# Patient Record
Sex: Male | Born: 2000 | Race: White | Hispanic: No | Marital: Single | Smoking: Never smoker
Health system: Southern US, Community
[De-identification: ages and names within clinical notes are randomized; demographics above are authoritative.]

---
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
## 2019-01-24 NOTE — Assessment & Plan Note (Signed)
Decision today to treat with OMT was based on Physical Exam  After verbal consent patient was treated with HVLA, ME, FPR techniques in cervical, thoracic, lumbar and sacral areas  Patient tolerated the procedure well with improvement in symptoms  Patient given exercises, stretches and lifestyle modifications  See medications in patient instructions if given  Patient will follow up in 4-6 weeks 

## 2019-01-24 NOTE — Progress Notes (Signed)
Tawana ScaleZach  D.O. [facility] [address] [place], [place] [address] Phone: [phone] Subjective:     CC: back pain follow-up  BJY:[id number]:Subjective  Dustin Fernandez is a 18 y.o. male coming in with complaint of back pain. Patient states patient has known scoliosis.  Patient continues to have discomfort and pain.  Patient recently has been at home.  More stress with about starting college in the fall.  May be doing more at home and not moving as much as he got previously.  Patient is also starting on a landscaping job.    No past medical history on file. No past surgical history on file. Social History   Socioeconomic History  . Marital status: Single    Spouse name: Not on file  . Number of children: Not on file  . Years of education: Not on file  . Highest education level: Not on file  Occupational History  . Not on file  Social Needs  . Financial resource strain: Not on file  . Food insecurity    Worry: Not on file    Inability: Not on file  . Transportation needs    Medical: Not on file    Non-medical: Not on file  Tobacco Use  . Smoking status: Never Smoker  . Smokeless tobacco: Never Used  Substance and Sexual Activity  . Alcohol use: Not on file  . Drug use: Not on file  . Sexual activity: Not on file  Lifestyle  . Physical activity    Days per week: Not on file    Minutes per session: Not on file  . Stress: Not on file  Relationships  . Social Musicianconnections    Talks on phone: Not on file    Gets together: Not on file    Attends religious service: Not on file    Active member of club or organization: Not on file    Attends meetings of clubs or organizations: Not on file    Relationship status: Not on file  Other Topics Concern  . Not on file  Social History Narrative  . Not on file   Allergies  Allergen Reactions  . Shellfish Allergy Rash   No family history on file.       Current Outpatient Medications (Other):  .  cholecalciferol  (VITAMIN D) 1000 units tablet, Take 2,000 Units by mouth daily.    Past medical history, social, surgical and family history all reviewed in electronic medical record.  No pertanent information unless stated regarding to the chief complaint.   Review of Systems:  No headache, visual changes, nausea, vomiting, diarrhea, constipation, dizziness, abdominal pain, skin rash, fevers, chills, night sweats, weight loss, swollen lymph nodes, body aches, joint swelling, muscle aches, chest pain, shortness of breath, mood changes.   Objective  Blood pressure 110/76, height 5\' 7"  (1.702 m), weight 112 lb (50.8 kg).  General: No apparent distress alert and oriented x3 mood and affect normal, dressed appropriately.  HEENT: Pupils equal, extraocular movements intact  Respiratory: Patient's speak in full sentences and does not appear short of breath  Cardiovascular: No lower extremity edema, non tender, no erythema  Skin: Warm dry intact with no signs of infection or rash on extremities or on axial skeleton.  Abdomen: Soft nontender  Neuro: Cranial nerves II through XII are intact, neurovascularly intact in all extremities with 2+ DTRs and 2+ pulses.  Lymph: No lymphadenopathy of posterior or anterior cervical chain or axillae bilaterally.  Gait normal with  good balance and coordination.  MSK:  Non tender with full range of motion and good stability and symmetric strength and tone of shoulders, elbows, wrist, hip, knee and ankles bilaterally.  Hypermobility noted Back Exam:  Inspection: Lumbar scoliosis noted Motion: Flexion 45 deg, Extension 45 deg, Side Bending to 35 deg bilaterally,  Rotation to 35 deg bilaterally  SLR laying: Negative  XSLR laying: Negative  Palpable tenderness: Mild tenderness to palpation in the thoracolumbar junction.Marland Kitchen FABER: negative. Sensory change: Gross sensation intact to all lumbar and sacral dermatomes.  Reflexes: 2+ at both patellar tendons, 2+ at achilles tendons,  Babinski's downgoing.  Strength at foot  Plantar-flexion: 5/5 Dorsi-flexion: 5/5 Eversion: 5/5 Inversion: 5/5  Leg strength  Quad: 5/5 Hamstring: 5/5 Hip flexor: 5/5 Hip abductors: 5/5  Gait unremarkable.  Osteopathic findings  C7 flexed rotated and side bent left T3 through T5 neutral rotated left and side bent right inhaled third rib T9 extended rotated and side bent left L3 flexed rotated and side bent left  Sacrum right on right    Impression and Recommendations:     This case required medical decision making of moderate complexity. The above documentation has been reviewed and is accurate and complete Lyndal Pulley, DO       Note: This dictation was prepared with Dragon dictation along with smaller phrase technology. Any transcriptional errors that result from this process are unintentional.

## 2019-01-24 NOTE — Assessment & Plan Note (Signed)
Idiopathic scoliosis.  Has done very well with conservative therapy.  Patient's growth plates at this time are likely closing and could repeat x-rays.  Discussed icing regimen and home exercises.  Follow-up again in 4 to 6 weeks

## 2019-01-26 ENCOUNTER — Ambulatory Visit: Payer: Self-pay | Admitting: Family Medicine

## 2019-01-26 ENCOUNTER — Other Ambulatory Visit: Payer: Self-pay

## 2019-01-26 ENCOUNTER — Encounter: Payer: Self-pay | Admitting: Family Medicine

## 2019-01-26 VITALS — BP 110/76 | Ht 67.0 in | Wt 112.0 lb

## 2019-01-26 DIAGNOSIS — M41125 Adolescent idiopathic scoliosis, thoracolumbar region: Secondary | ICD-10-CM

## 2019-01-26 DIAGNOSIS — M999 Biomechanical lesion, unspecified: Secondary | ICD-10-CM

## 2019-01-26 NOTE — Patient Instructions (Addendum)
See me again in 4 weeks  

## 2019-02-22 NOTE — Progress Notes (Deleted)
[facility] [address] [place], [place] [address] Phone: [phone] Subjective:    I'm seeing this patient by the request  of:    CC:   BTD:[id number]  Dustin Fernandez is a 18 y.o. male coming in with complaint of ***  Onset-  Location Duration-  Character- Aggravating factors- Reliving factors-  Therapies tried-  Severity-     No past medical history on file. No past surgical history on file. Social History   Socioeconomic History  . Marital status: Single    Spouse name: Not on file  . Number of children: Not on file  . Years of education: Not on file  . Highest education level: Not on file  Occupational History  . Not on file  Social Needs  . Financial resource strain: Not on file  . Food insecurity    Worry: Not on file    Inability: Not on file  . Transportation needs    Medical: Not on file    Non-medical: Not on file  Tobacco Use  . Smoking status: Never Smoker  . Smokeless tobacco: Never Used  Substance and Sexual Activity  . Alcohol use: Not on file  . Drug use: Not on file  . Sexual activity: Not on file  Lifestyle  . Physical activity    Days per week: Not on file    Minutes per session: Not on file  . Stress: Not on file  Relationships  . Social Herbalist on phone: Not on file    Gets together: Not on file    Attends religious service: Not on file    Active member of club or organization: Not on file    Attends meetings of clubs or organizations: Not on file    Relationship status: Not on file  Other Topics Concern  . Not on file  Social History Narrative  . Not on file   Allergies  Allergen Reactions  . Shellfish Allergy Rash   No family history on file.       Current Outpatient Medications (Other):  .  cholecalciferol (VITAMIN D) 1000 units tablet, Take 2,000 Units by mouth daily.    Past medical history, social, surgical and family history all reviewed in electronic  medical record.  No pertanent information unless stated regarding to the chief complaint.   Review of Systems:  No headache, visual changes, nausea, vomiting, diarrhea, constipation, dizziness, abdominal pain, skin rash, fevers, chills, night sweats, weight loss, swollen lymph nodes, body aches, joint swelling, muscle aches, chest pain, shortness of breath, mood changes.   Objective  There were no vitals taken for this visit. Systems examined below as of    General: No apparent distress alert and oriented x3 mood and affect normal, dressed appropriately.  HEENT: Pupils equal, extraocular movements intact  Respiratory: Patient's speak in full sentences and does not appear short of breath  Cardiovascular: No lower extremity edema, non tender, no erythema  Skin: Warm dry intact with no signs of infection or rash on extremities or on axial skeleton.  Abdomen: Soft nontender  Neuro: Cranial nerves II through XII are intact, neurovascularly intact in all extremities with 2+ DTRs and 2+ pulses.  Lymph: No lymphadenopathy of posterior or anterior cervical chain or axillae bilaterally.  Gait normal with good balance and coordination.  MSK:  Non tender with full range of motion and good stability and symmetric strength and tone of shoulders, elbows, wrist, hip, knee and  ankles bilaterally.  Back Exam:  Inspection: Scoliosis noted Motion: Flexion 45 deg, Extension 25 deg, Side Bending to 45 deg bilaterally,  Rotation to 45 deg bilaterally  SLR laying: Negative  XSLR laying: Negative  Palpable tenderness: None. FABER: negative. Sensory change: Gross sensation intact to all lumbar and sacral dermatomes.  Reflexes: 2+ at both patellar tendons, 2+ at achilles tendons, Babinski's downgoing.  Strength at foot  Plantar-flexion: 5/5 Dorsi-flexion: 5/5 Eversion: 5/5 Inversion: 5/5  Leg strength  Quad: 5/5 Hamstring: 5/5 Hip flexor: 5/5 Hip abductors: 5/5  Gait unremarkable.  Osteopathic findings  C4  flexed rotated and side bent left C6 flexed rotated and side bent left T3 extended rotated and side bent right inhaled third rib T9 extended rotated and side bent left L2 flexed rotated and side bent right Sacrum right on right    Impression and Recommendations:     This case required medical decision making of moderate complexity. The above documentation has been reviewed and is accurate and complete Dustin SaaZachary M Morena Mckissack, DO       Note: This dictation was prepared with Dragon dictation along with smaller phrase technology. Any transcriptional errors that result from this process are unintentional.

## 2019-02-23 ENCOUNTER — Ambulatory Visit: Payer: Self-pay | Admitting: Family Medicine

## 2019-07-22 ENCOUNTER — Ambulatory Visit: Payer: HRSA Program | Attending: Internal Medicine

## 2019-07-22 DIAGNOSIS — Z20828 Contact with and (suspected) exposure to other viral communicable diseases: Secondary | ICD-10-CM | POA: Insufficient documentation

## 2019-07-22 DIAGNOSIS — Z20822 Contact with and (suspected) exposure to covid-19: Secondary | ICD-10-CM

## 2019-07-23 LAB — NOVEL CORONAVIRUS, NAA: SARS-CoV-2, NAA: NOT DETECTED

## 2020-02-09 DIAGNOSIS — Z0000 Encounter for general adult medical examination without abnormal findings: Secondary | ICD-10-CM | POA: Diagnosis not present

## 2021-08-31 ENCOUNTER — Encounter (HOSPITAL_COMMUNITY): Payer: Self-pay

## 2021-08-31 ENCOUNTER — Other Ambulatory Visit: Payer: Self-pay

## 2021-08-31 ENCOUNTER — Ambulatory Visit (INDEPENDENT_AMBULATORY_CARE_PROVIDER_SITE_OTHER): Payer: Self-pay

## 2021-08-31 ENCOUNTER — Ambulatory Visit (HOSPITAL_COMMUNITY)
Admission: EM | Admit: 2021-08-31 | Discharge: 2021-08-31 | Disposition: A | Discharge status: Home / Self Care | Payer: Self-pay | Attending: Student | Admitting: Student

## 2021-08-31 DIAGNOSIS — S93491A Sprain of other ligament of right ankle, initial encounter: Secondary | ICD-10-CM

## 2021-08-31 DIAGNOSIS — M25571 Pain in right ankle and joints of right foot: Secondary | ICD-10-CM

## 2021-08-31 NOTE — ED Provider Notes (Signed)
[facility]    CSN: [id number] Arrival date & time: 08/31/21  1344      History   Chief Complaint Chief Complaint  Patient presents with   Ankle Pain    HPI MACY LINGENFELTER is a 21 y.o. male presenting with right ankle pain following a skateboarding injury.  Medical history noncontributory, denies history of issues with his ankle in the past.  States that he was skateboarding 1 day ago when he jumped off the skateboard and inverted his right ankle.  Now with pain and swelling over the lateral aspect.  Denies sensation changes.  His family had an ASO brace already, which he is using with some relief.  Pain is worse with ambulating and with movement, but tolerable at rest.  Has attempted ibuprofen with some relief.  Denies sensation changes.  HPI  History reviewed. No pertinent past medical history.  Patient Active Problem List   Diagnosis Date Noted   Nonallopathic lesion of sacral region 10/12/2015   Idiopathic scoliosis 01/06/2015   Nonallopathic lesion of thoracic region 01/06/2015   Nonallopathic lesion of lumbosacral region 01/06/2015    History reviewed. No pertinent surgical history.     Home Medications    Prior to Admission medications   Medication Sig Start Date End Date Taking? Authorizing Provider  cholecalciferol (VITAMIN D) 1000 units tablet Take 2,000 Units by mouth daily.    [provider]    Family History History reviewed. No pertinent family history.  Social History Social History   Tobacco Use   Smoking status: Never   Smokeless tobacco: Never  Substance Use Topics   Alcohol use: Not Currently   Drug use: Not Currently     Allergies   Shellfish allergy   Review of Systems Review of Systems  Musculoskeletal:        R ankle pain  All other systems reviewed and are negative.   Physical Exam Triage Vital Signs ED Triage Vitals [08/31/21 1504]  Enc Vitals Group     BP (!) 144/86     Pulse Rate 88     Resp  16     Temp 97.6 F (36.4 C)     Temp Source Oral     SpO2 97 %     Weight      Height      Head Circumference      Peak Flow      Pain Score 2     Pain Loc      Pain Edu?      Excl. in GC?    No data found.  Updated Vital Signs BP (!) 144/86 (BP Location: Left Arm)    Pulse 88    Temp 97.6 F (36.4 C) (Oral)    Resp 16    SpO2 97%   Visual Acuity Right Eye Distance:   Left Eye Distance:   Bilateral Distance:    Right Eye Near:   Left Eye Near:    Bilateral Near:     Physical Exam Vitals reviewed.  Constitutional:      General: He is not in acute distress.    Appearance: Normal appearance. He is not ill-appearing.  HENT:     Head: Normocephalic and atraumatic.  Pulmonary:     Effort: Pulmonary effort is normal.  Musculoskeletal:     Comments: R ankle - TTP, 1+ swelling, ecchymosis lateral malleolus. Pain elicited with plantar and dorsiflexion. No proximal fibular or tibial pain. No midfoot or metatarsal pain  or deformity. Ambulating with pain. DP 2+, cap refill <2 seconds.  Neurological:     General: No focal deficit present.     Mental Status: He is alert and oriented to person, place, and time.  Psychiatric:        Mood and Affect: Mood normal.        Behavior: Behavior normal.        Thought Content: Thought content normal.        Judgment: Judgment normal.     UC Treatments / Results  Labs (all labs ordered are listed, but only abnormal results are displayed) Labs Reviewed - No data to display  EKG   Radiology DG Ankle Complete Right  Result Date: 08/31/2021 CLINICAL DATA:  Trauma, pain EXAM: RIGHT ANKLE - COMPLETE 3+ VIEW COMPARISON:  None. FINDINGS: No displaced fracture or dislocation is seen. There is soft tissue swelling over the lateral malleolus. IMPRESSION: No recent fracture or dislocation is seen in the right ankle. Electronically Signed   By: Ernie Avena M.D.   On: 08/31/2021 15:25    Procedures Procedures (including critical care  time)  Medications Ordered in UC Medications - No data to display  Initial Impression / Assessment and Plan / UC Course  I have reviewed the triage vital signs and the nursing notes.  Pertinent labs & imaging results that were available during my care of the patient were reviewed by me and considered in my medical decision making (see chart for details).     This patient is a very pleasant 21 y.o. year old male presenting with R ankle sprain. Neurovascuarly intact.Herby Abraham L ankle - No recent fracture or dislocation is seen in the right ankle.  He already has an ASO brace, continue this.   ED return precautions discussed. Patient verbalizes understanding and agreement.    Final Clinical Impressions(s) / UC Diagnoses   Final diagnoses:  Sprain of anterior talofibular ligament of right ankle, initial encounter     Discharge Instructions      -You can take Tylenol up to 1000 mg 3 times daily, and ibuprofen up to 600 mg 3 times daily with food.  You can take these together, or alternate every 3-4 hours. -Rest, ice, elevation -If symptoms persist in 7-10 days, follow-up with an orthopedist. I recommend [facility] at [address], [place], [place] [address]. You can schedule an appointment by calling [phone]) or online ([email or web address]), but they also have a walk-in clinic M-F 8a-8p and Sat 10a-3p.      ED Prescriptions   None    PDMP not reviewed this encounter.   Rhys Martini, PA-C 08/31/21 1544

## 2021-08-31 NOTE — ED Triage Notes (Signed)
Pt states twisted rt ankle while skate boarding last night. Has a ankle brace on and took ibuprofen this am. C/o swelling and pain.

## 2021-08-31 NOTE — Discharge Instructions (Addendum)
-  You can take Tylenol up to 1000 mg 3 times daily, and ibuprofen up to 600 mg 3 times daily with food.  You can take these together, or alternate every 3-4 hours. -Rest, ice, elevation -If symptoms persist in 7-10 days, follow-up with an orthopedist. I recommend [facility] at [address], [place], [place] [address]. You can schedule an appointment by calling [phone]) or online ([email or web address]), but they also have a walk-in clinic M-F 8a-8p and Sat 10a-3p.
